# Patient Record
Sex: Female | Born: 2000
Health system: Southern US, Community
[De-identification: ages and names within clinical notes are randomized; demographics above are authoritative.]

## PROBLEM LIST (undated history)

## (undated) DIAGNOSIS — K5900 Constipation, unspecified: Secondary | ICD-10-CM

## (undated) HISTORY — PX: BLADDER SURGERY: SHX569

---
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
## 2019-08-21 DIAGNOSIS — L42 Pityriasis rosea: Secondary | ICD-10-CM | POA: Diagnosis not present

## 2019-11-28 DIAGNOSIS — Z793 Long term (current) use of hormonal contraceptives: Secondary | ICD-10-CM | POA: Diagnosis not present

## 2019-11-28 DIAGNOSIS — Z1389 Encounter for screening for other disorder: Secondary | ICD-10-CM | POA: Diagnosis not present

## 2019-11-28 DIAGNOSIS — Z68.41 Body mass index (BMI) pediatric, less than 5th percentile for age: Secondary | ICD-10-CM | POA: Diagnosis not present

## 2019-11-28 DIAGNOSIS — Z113 Encounter for screening for infections with a predominantly sexual mode of transmission: Secondary | ICD-10-CM | POA: Diagnosis not present

## 2019-11-28 DIAGNOSIS — R634 Abnormal weight loss: Secondary | ICD-10-CM | POA: Diagnosis not present

## 2020-04-29 DIAGNOSIS — Z20822 Contact with and (suspected) exposure to covid-19: Secondary | ICD-10-CM | POA: Diagnosis not present

## 2021-01-22 DIAGNOSIS — Z539 Procedure and treatment not carried out, unspecified reason: Secondary | ICD-10-CM | POA: Diagnosis not present

## 2021-05-08 DIAGNOSIS — Z23 Encounter for immunization: Secondary | ICD-10-CM | POA: Diagnosis not present

## 2021-05-08 DIAGNOSIS — R636 Underweight: Secondary | ICD-10-CM | POA: Diagnosis not present

## 2021-05-08 DIAGNOSIS — Z124 Encounter for screening for malignant neoplasm of cervix: Secondary | ICD-10-CM | POA: Diagnosis not present

## 2021-05-08 DIAGNOSIS — Z0000 Encounter for general adult medical examination without abnormal findings: Secondary | ICD-10-CM | POA: Diagnosis not present

## 2021-05-08 DIAGNOSIS — Z681 Body mass index (BMI) 19 or less, adult: Secondary | ICD-10-CM | POA: Diagnosis not present

## 2021-05-08 DIAGNOSIS — Z1331 Encounter for screening for depression: Secondary | ICD-10-CM | POA: Diagnosis not present

## 2022-12-31 DIAGNOSIS — Z0000 Encounter for general adult medical examination without abnormal findings: Secondary | ICD-10-CM | POA: Diagnosis not present

## 2022-12-31 DIAGNOSIS — Z1331 Encounter for screening for depression: Secondary | ICD-10-CM | POA: Diagnosis not present

## 2022-12-31 DIAGNOSIS — Z111 Encounter for screening for respiratory tuberculosis: Secondary | ICD-10-CM | POA: Diagnosis not present

## 2022-12-31 DIAGNOSIS — Z681 Body mass index (BMI) 19 or less, adult: Secondary | ICD-10-CM | POA: Diagnosis not present

## 2023-01-09 DIAGNOSIS — Z23 Encounter for immunization: Secondary | ICD-10-CM | POA: Diagnosis not present

## 2023-04-12 DIAGNOSIS — R7401 Elevation of levels of liver transaminase levels: Secondary | ICD-10-CM | POA: Diagnosis not present

## 2023-04-12 DIAGNOSIS — E785 Hyperlipidemia, unspecified: Secondary | ICD-10-CM | POA: Diagnosis not present
